# Patient Record
Sex: Female | Born: 1995 | Race: Black or African American | Hispanic: No | Marital: Single | Smoking: Never smoker
Health system: Southern US, Community
[De-identification: ages and names within clinical notes are randomized; demographics above are authoritative.]

## PROBLEM LIST (undated history)

## (undated) ENCOUNTER — Inpatient Hospital Stay (HOSPITAL_COMMUNITY): Payer: Self-pay

## (undated) DIAGNOSIS — R51 Headache: Secondary | ICD-10-CM

## (undated) DIAGNOSIS — R519 Headache, unspecified: Secondary | ICD-10-CM

---

## 2016-06-24 ENCOUNTER — Encounter (HOSPITAL_COMMUNITY): Payer: Self-pay

## 2016-06-24 ENCOUNTER — Emergency Department (HOSPITAL_COMMUNITY)
Admission: EM | Admit: 2016-06-24 | Discharge: 2016-06-24 | Disposition: A | Discharge status: Home / Self Care | Payer: Medicaid Other | Attending: Emergency Medicine | Admitting: Emergency Medicine

## 2016-06-24 DIAGNOSIS — Z3A00 Weeks of gestation of pregnancy not specified: Secondary | ICD-10-CM | POA: Diagnosis not present

## 2016-06-24 DIAGNOSIS — Z3490 Encounter for supervision of normal pregnancy, unspecified, unspecified trimester: Secondary | ICD-10-CM

## 2016-06-24 DIAGNOSIS — O219 Vomiting of pregnancy, unspecified: Secondary | ICD-10-CM | POA: Diagnosis not present

## 2016-06-24 HISTORY — DX: Headache: R51

## 2016-06-24 HISTORY — DX: Headache, unspecified: R51.9

## 2016-06-24 LAB — CBC
HCT: 37.1 % (ref 36.0–46.0)
HEMOGLOBIN: 12.2 g/dL (ref 12.0–15.0)
MCH: 27.7 pg (ref 26.0–34.0)
MCHC: 32.9 g/dL (ref 30.0–36.0)
MCV: 84.3 fL (ref 78.0–100.0)
Platelets: 229 10*3/uL (ref 150–400)
RBC: 4.4 MIL/uL (ref 3.87–5.11)
RDW: 13.3 % (ref 11.5–15.5)
WBC: 6.3 10*3/uL (ref 4.0–10.5)

## 2016-06-24 LAB — COMPREHENSIVE METABOLIC PANEL
ALK PHOS: 27 U/L — AB (ref 38–126)
ALT: 9 U/L — AB (ref 14–54)
AST: 22 U/L (ref 15–41)
Albumin: 4 g/dL (ref 3.5–5.0)
Anion gap: 6 (ref 5–15)
BUN: 10 mg/dL (ref 6–20)
CALCIUM: 9.4 mg/dL (ref 8.9–10.3)
CO2: 22 mmol/L (ref 22–32)
CREATININE: 0.6 mg/dL (ref 0.44–1.00)
Chloride: 107 mmol/L (ref 101–111)
GFR calc Af Amer: >60 mL/min (ref >60)
Glucose, Bld: 77 mg/dL (ref 65–99)
Potassium: 4 mmol/L (ref 3.5–5.1)
Sodium: 135 mmol/L (ref 135–145)
Total Bilirubin: 1 mg/dL (ref 0.3–1.2)
Total Protein: 7.2 g/dL (ref 6.5–8.1)

## 2016-06-24 LAB — POC URINE PREG, ED: Preg Test, Ur: POSITIVE — AB

## 2016-06-24 NOTE — ED Notes (Signed)
Declined W/C at D/C and was escorted to lobby by RN. 

## 2016-06-24 NOTE — ED Triage Notes (Addendum)
Patient complains of generalized headache with nausea and photophobia x 1 week. States taking ibuprofen with minimal relief, denies trauma. alert and oriented, NAD.no cold symptoms, denies congestion

## 2016-06-24 NOTE — ED Provider Notes (Signed)
[facility]-EMERGENCY DEPT Provider Note   CSN: [id number] Arrival date & time: 06/24/16  1427     History   Chief Complaint Chief Complaint  Patient presents with  . Headache    HPI Aimee Wong is a 21 y.o. female.  She presents today for multiple weeks of worsening nausea, vomiting, low energy and headache. She reports that her symptoms are intermittent but that over the past week her nausea/vomiting, and headaches are getting more frequent. She reports that she has been able to eat and stay adequately hydrated. She reports that she has tried ibuprofen for her headaches however reports that it has not helped.  She also endorses mild photophobia, and reports that she prefers having the light off but describes no difficulty using her cell phone.  She denies any recent sick contacts.  She denies any fevers or chills at home, no hot flashes.  No neck pain/stiffness.    She is unable to recall the date of her last menstrual period, reports that she is sexually active with her boyfriend.  She has previously been pregnant and has a 10-year-old daughter.  She denies any abnormal vaginal bleeding, discharge, or pain.        Past Medical History:  Diagnosis Date  . Headache     There are no active problems to display for this patient.   History reviewed. No pertinent surgical history.  OB History    No data available       Home Medications    Prior to Admission medications   Not on File    Family History No family history on file.  Social History Social History  Substance Use Topics  . Smoking status: Never Smoker  . Smokeless tobacco: Never Used  . Alcohol use No     Allergies   Patient has no known allergies.   Review of Systems Review of Systems  Constitutional: Positive for fatigue. Negative for chills, fever and unexpected weight change.  HENT: Negative for ear pain and sore throat.   Eyes: Negative for pain and visual disturbance.  Respiratory: Positive for  shortness of breath (Occasionally, when walking, not new). Negative for cough and chest tightness.   Cardiovascular: Negative for chest pain, palpitations and leg swelling.  Gastrointestinal: Positive for nausea and vomiting. Negative for abdominal pain, constipation and diarrhea.  Genitourinary: Negative for dysuria, flank pain, frequency, hematuria, menstrual problem, vaginal bleeding, vaginal discharge and vaginal pain.  Musculoskeletal: Negative for arthralgias and back pain.  Skin: Negative for color change and rash.  Neurological: Positive for light-headedness and headaches. Negative for seizures and syncope.  All other systems reviewed and are negative.    Physical Exam Updated Vital Signs BP 104/66 (BP Location: Right Arm)   Pulse 75   Temp 98.3 F (36.8 C) (Oral)   Resp 14   Ht  (1.575 m)   Wt 53.1 kg   SpO2 100%   BMI 21.40 kg/m   Physical Exam  Constitutional: She is oriented to person, place, and time. Vital signs are normal. She appears well-developed and well-nourished.  Non-toxic appearance. She does not have a sickly appearance. She does not appear ill. No distress.  HENT:  Head: Normocephalic and atraumatic. Head is without contusion.  Right Ear: External ear normal.  Left Ear: External ear normal.  Nose: Nose normal.  Mouth/Throat: Uvula is midline and oropharynx is clear and moist.  Eyes: Conjunctivae are normal. Pupils are equal, round, and reactive to light. No scleral icterus.  Neck:  Normal range of motion. Neck supple. No tracheal deviation present.  Cardiovascular: Normal rate, regular rhythm, S1 normal, S2 normal, normal heart sounds and intact distal pulses.  Exam reveals no gallop and no friction rub.   No murmur heard. Pulmonary/Chest: Effort normal and breath sounds normal. No accessory muscle usage. No respiratory distress. She exhibits no retraction.  Abdominal: Soft. Bowel sounds are normal. There is no hepatosplenomegaly. There is no  tenderness.  Her uterus was not palpable.    Musculoskeletal: She exhibits no edema.  Neurological: She is alert and oriented to person, place, and time. She has normal strength. No cranial nerve deficit or sensory deficit. She exhibits normal muscle tone. Gait normal. GCS eye subscore is 4. GCS verbal subscore is 5. GCS motor subscore is 6.  Skin: Skin is warm and dry. She is not diaphoretic.  Psychiatric: She has a normal mood and affect. Her behavior is normal.  Nursing note and vitals reviewed.    ED Treatments / Results  Labs (all labs ordered are listed, but only abnormal results are displayed) Labs Reviewed  COMPREHENSIVE METABOLIC PANEL - Abnormal; Notable for the following:       Result Value   ALT 9 (*)    Alkaline Phosphatase 27 (*)    All other components within normal limits  POC URINE PREG, ED - Abnormal; Notable for the following:    Preg Test, Ur POSITIVE (*)    All other components within normal limits  CBC    EKG  EKG Interpretation None       Radiology No results found.  Procedures Procedures (including critical care time)  Medications Ordered in ED Medications - No data to display   Initial Impression / Assessment and Plan / ED Course  I have reviewed the triage vital signs and the nursing notes.  Pertinent labs & imaging results that were available during my care of the patient were reviewed by me and considered in my medical decision making (see chart for details).    Aimee Wong presents today for evaluation of gradually worsening nausea/vomiting, weakness, and headache.  Her urine pregnancy test came back positive.  She is afebrile, normotensive with a normal heart rate.  She has been pregnant before and reports that "preggo pops" helped with her nausea and stated she would try them again.  She was advised not to drink alcohol, or consume drugs while pregnant. She was advised to start taking a prenatal vitamin.  Was given the contact information  for the woman's clinic and instructed to follow up with them.  At this point as she is not having pelvic pain, abnormal vaginal bleeding, and is normotensive I do not have cause to suspect a ruptured ectopic pregnancy.  He was advised to eat multiple small meals throughout the day, and to refrain from taking any over-the-counter medications including ibuprofen, unless told otherwise by the woman's clinic.  At discharge she was given a handout containing additional pregnancy related guidelines.  Additionally she was given return precautions including but not limited to signs and symptoms of ectopic pregnancy , dehydration, fever, chills.    Her lab work was reviewed she does not appear to be anemic, or have any significant electrolyte imbalances at this time. At this time there does not appear to be any evidence of an acute emergency medical condition and the patient appears stable for discharge with appropriate outpatient follow up.Diagnosis was discussed with patient who verbalizes understanding and is agreeable to discharge. Pt case discussed  with Dr. Patria Mane who agrees with my plan.   Final Clinical Impressions(s) / ED Diagnoses   Final diagnoses:  Pregnancy, unspecified gestational age  Nausea and vomiting during pregnancy    New Prescriptions There are no discharge medications for this patient.    Cristina Gong, PA-C 06/24/16 1901    Azalia Bilis, MD 06/26/16 [phone]

## 2016-07-05 ENCOUNTER — Encounter: Payer: Self-pay | Admitting: Advanced Practice Midwife

## 2016-07-05 ENCOUNTER — Inpatient Hospital Stay (HOSPITAL_COMMUNITY): Payer: Medicaid Other

## 2016-07-05 ENCOUNTER — Inpatient Hospital Stay (HOSPITAL_COMMUNITY)
Admission: AD | Admit: 2016-07-05 | Discharge: 2016-07-05 | Disposition: A | Discharge status: Home / Self Care | Payer: Medicaid Other | Source: Ambulatory Visit | Attending: Obstetrics & Gynecology | Admitting: Obstetrics & Gynecology

## 2016-07-05 DIAGNOSIS — Z3A08 8 weeks gestation of pregnancy: Secondary | ICD-10-CM | POA: Diagnosis not present

## 2016-07-05 DIAGNOSIS — R1030 Lower abdominal pain, unspecified: Secondary | ICD-10-CM | POA: Diagnosis present

## 2016-07-05 DIAGNOSIS — O26891 Other specified pregnancy related conditions, first trimester: Secondary | ICD-10-CM | POA: Insufficient documentation

## 2016-07-05 DIAGNOSIS — Z3491 Encounter for supervision of normal pregnancy, unspecified, first trimester: Secondary | ICD-10-CM

## 2016-07-05 DIAGNOSIS — O208 Other hemorrhage in early pregnancy: Secondary | ICD-10-CM | POA: Diagnosis not present

## 2016-07-05 DIAGNOSIS — R109 Unspecified abdominal pain: Secondary | ICD-10-CM

## 2016-07-05 DIAGNOSIS — O2341 Unspecified infection of urinary tract in pregnancy, first trimester: Secondary | ICD-10-CM | POA: Diagnosis not present

## 2016-07-05 LAB — URINALYSIS, ROUTINE W REFLEX MICROSCOPIC
BILIRUBIN URINE: NEGATIVE
Glucose, UA: NEGATIVE mg/dL
HGB URINE DIPSTICK: NEGATIVE
Ketones, ur: 80 mg/dL — AB
NITRITE: POSITIVE — AB
Protein, ur: 30 mg/dL — AB
SPECIFIC GRAVITY, URINE: 1.029 (ref 1.005–1.030)
pH: 6 (ref 5.0–8.0)

## 2016-07-05 LAB — CBC
HCT: 37.8 % (ref 36.0–46.0)
Hemoglobin: 12.7 g/dL (ref 12.0–15.0)
MCH: 28.2 pg (ref 26.0–34.0)
MCHC: 33.6 g/dL (ref 30.0–36.0)
MCV: 83.8 fL (ref 78.0–100.0)
PLATELETS: 272 10*3/uL (ref 150–400)
RBC: 4.51 MIL/uL (ref 3.87–5.11)
RDW: 13 % (ref 11.5–15.5)
WBC: 6.6 10*3/uL (ref 4.0–10.5)

## 2016-07-05 LAB — WET PREP, GENITAL
Sperm: NONE SEEN
Trich, Wet Prep: NONE SEEN

## 2016-07-05 LAB — HCG, QUANTITATIVE, PREGNANCY: hCG, Beta Chain, Quant, S: 165443 m[IU]/mL — ABNORMAL HIGH (ref <5)

## 2016-07-05 MED ORDER — CONCEPT OB 130-92.4-1 MG PO CAPS: 1.0000 | ORAL_CAPSULE | Freq: Every day | ORAL | 12 refills | Status: AC

## 2016-07-05 MED ORDER — NITROFURANTOIN MONOHYD MACRO 100 MG PO CAPS: 100.0000 mg | ORAL_CAPSULE | Freq: Two times a day (BID) | ORAL | 0 refills | Status: AC

## 2016-07-05 NOTE — MAU Note (Signed)
Pt states she had a +upt at [facility] several weeks ago. Started having lower abdominal cramping that started several days ago. Pt denies vaginal bleeding or vaginal discharge. Pt denies pain at this time. Has not taken anything for pain.

## 2016-07-05 NOTE — Discharge Instructions (Signed)
[facility] [employer] for [employer] at [facility]       Phone: [phone]  Center for [employer] at [employer] Phone: [phone]  Center for [employer] at [place]  Phone: [phone]  Center for [employer] at [employer]  Phone: [phone]  Center for [facility] [place]  Phone: [phone]  [facility]       Phone: [phone]  [facility]    Phone: [phone]   [facility] [place])    Phone: [phone]  [facility]    Phone: [phone]  [facility]    Phone: [phone]  [facility]    Phone: [phone]  [facility]       Phone: [phone]   [facility]  Phone: [phone]  [facility]    Phone: [phone]  [facility]   Phone: [phone]  [facility]      Phone: [phone]  [facility]    Phone: [phone]    Abdominal Pain During Pregnancy Abdominal pain is common in pregnancy. Most of the time, it does not cause harm. There are many causes of abdominal pain. Some causes are more serious than others and sometimes the cause is not known. Abdominal pain can be a sign that something is very wrong with the pregnancy or the pain may have nothing to do with the pregnancy. Always tell your health care provider if you have any abdominal pain. Follow these instructions at home:  Do not have sex or put anything in your vagina until your symptoms go away completely.  Watch your abdominal pain for any changes.  Get plenty of rest until your pain improves.  Drink enough fluid to keep your urine clear or pale yellow.  Take over-the-counter or prescription medicines only as told by your health care provider.  Keep  all follow-up visits as told by your health care provider. This is important. Contact a health care provider if:  You have a fever.  Your pain gets worse or you have cramping.  Your pain continues after resting. Get help right away if:  You are bleeding, leaking fluid, or passing tissue from the vagina.  You have vomiting or diarrhea that does not go away.  You have painful or bloody urination.  You notice a decrease in your baby's movements.  You feel very weak or faint.  You have shortness of breath.  You develop a severe headache with abdominal pain.  You have abnormal vaginal discharge with abdominal pain. This information is not intended to replace advice given to you by your health care provider. Make sure you discuss any questions you have with your health care provider. Document Released: 03/13/2005 Document Revised: 12/23/2015 Document Reviewed: 10/10/2012 Elsevier Interactive Patient Education  2017 [employer].   First Trimester of Pregnancy The first trimester of pregnancy is from week 1 until the end of week 13 (months 1 through 3). A week after a sperm fertilizes an egg, the egg will implant on the wall of the uterus. This embryo will begin to develop into a baby. Genes from you and your partner will form the baby. The female genes will determine whether the baby will be a boy or a girl. At 6-8 weeks, the eyes and face will be formed, and the heartbeat can be seen on ultrasound. At the end of 12 weeks, all  the baby's organs will be formed. Now that you are pregnant, you will want to do everything you can to have a healthy baby. Two of the most important things are to get good prenatal care and to follow your health care provider's instructions. Prenatal care is all the medical care you receive before the baby's birth. This care will help prevent, find, and treat any problems during the pregnancy and childbirth. Body changes during your first trimester Your body goes  through many changes during pregnancy. The changes vary from woman to woman.  You may gain or lose a couple of pounds at first.  You may feel sick to your stomach (nauseous) and you may throw up (vomit). If the vomiting is uncontrollable, call your health care provider.  You may tire easily.  You may develop headaches that can be relieved by medicines. All medicines should be approved by your health care provider.  You may urinate more often. Painful urination may mean you have a bladder infection.  You may develop heartburn as a result of your pregnancy.  You may develop constipation because certain hormones are causing the muscles that push stool through your intestines to slow down.  You may develop hemorrhoids or swollen veins (varicose veins).  Your breasts may begin to grow larger and become tender. Your nipples may stick out more, and the tissue that surrounds them (areola) may become darker.  Your gums may bleed and may be sensitive to brushing and flossing.  Dark spots or blotches (chloasma, mask of pregnancy) may develop on your face. This will likely fade after the baby is born.  Your menstrual periods will stop.  You may have a loss of appetite.  You may develop cravings for certain kinds of food.  You may have changes in your emotions from day to day, such as being excited to be pregnant or being concerned that something may go wrong with the pregnancy and baby.  You may have more vivid and strange dreams.  You may have changes in your hair. These can include thickening of your hair, rapid growth, and changes in texture. Some women also have hair loss during or after pregnancy, or hair that feels dry or thin. Your hair will most likely return to normal after your baby is born. What to expect at prenatal visits During a routine prenatal visit:  You will be weighed to make sure you and the baby are growing normally.  Your blood pressure will be taken.  Your  abdomen will be measured to track your baby's growth.  The fetal heartbeat will be listened to between weeks 10 and 14 of your pregnancy.  Test results from any previous visits will be discussed. Your health care provider may ask you:  How you are feeling.  If you are feeling the baby move.  If you have had any abnormal symptoms, such as leaking fluid, bleeding, severe headaches, or abdominal cramping.  If you are using any tobacco products, including cigarettes, chewing tobacco, and electronic cigarettes.  If you have any questions. Other tests that may be performed during your first trimester include:  Blood tests to find your blood type and to check for the presence of any previous infections. The tests will also be used to check for low iron levels (anemia) and protein on red blood cells (Rh antibodies). Depending on your risk factors, or if you previously had diabetes during pregnancy, you may have tests to check for high blood sugar that affects pregnant women (  gestational diabetes).  Urine tests to check for infections, diabetes, or protein in the urine.  An ultrasound to confirm the proper growth and development of the baby.  Fetal screens for spinal cord problems (spina bifida) and Down syndrome.  HIV (human immunodeficiency virus) testing. Routine prenatal testing includes screening for HIV, unless you choose not to have this test.  You may need other tests to make sure you and the baby are doing well. Follow these instructions at home: Medicines   Follow your health care provider's instructions regarding medicine use. Specific medicines may be either safe or unsafe to take during pregnancy.  Take a prenatal vitamin that contains at least 600 micrograms (mcg) of folic acid.  If you develop constipation, try taking a stool softener if your health care provider approves. Eating and drinking   Eat a balanced diet that includes fresh fruits and vegetables, whole grains,  good sources of protein such as meat, eggs, or tofu, and low-fat dairy. Your health care provider will help you determine the amount of weight gain that is right for you.  Avoid raw meat and uncooked cheese. These carry germs that can cause birth defects in the baby.  Eating four or five small meals rather than three large meals a day may help relieve nausea and vomiting. If you start to feel nauseous, eating a few soda crackers can be helpful. Drinking liquids between meals, instead of during meals, also seems to help ease nausea and vomiting.  Limit foods that are high in fat and processed sugars, such as fried and sweet foods.  To prevent constipation:  Eat foods that are high in fiber, such as fresh fruits and vegetables, whole grains, and beans.  Drink enough fluid to keep your urine clear or pale yellow. Activity   Exercise only as directed by your health care provider. Most women can continue their usual exercise routine during pregnancy. Try to exercise for 30 minutes at least 5 days a week. Exercising will help you:  Control your weight.  Stay in shape.  Be prepared for labor and delivery.  Experiencing pain or cramping in the lower abdomen or lower back is a good sign that you should stop exercising. Check with your health care provider before continuing with normal exercises.  Try to avoid standing for long periods of time. Move your legs often if you must stand in one place for a long time.  Avoid heavy lifting.  Wear low-heeled shoes and practice good posture.  You may continue to have sex unless your health care provider tells you not to. Relieving pain and discomfort   Wear a good support bra to relieve breast tenderness.  Take warm sitz baths to soothe any pain or discomfort caused by hemorrhoids. Use hemorrhoid cream if your health care provider approves.  Rest with your legs elevated if you have leg cramps or low back pain.  If you develop varicose veins in  your legs, wear support hose. Elevate your feet for 15 minutes, 3-4 times a day. Limit salt in your diet. Prenatal care   Schedule your prenatal visits by the twelfth week of pregnancy. They are usually scheduled monthly at first, then more often in the last 2 months before delivery.  Write down your questions. Take them to your prenatal visits.  Keep all your prenatal visits as told by your health care provider. This is important. Safety   Wear your seat belt at all times when driving.  Make a list of emergency  phone numbers, including numbers for family, friends, the hospital, and police and fire departments. General instructions   Ask your health care provider for a referral to a local prenatal education class. Begin classes no later than the beginning of month 6 of your pregnancy.  Ask for help if you have counseling or nutritional needs during pregnancy. Your health care provider can offer advice or refer you to specialists for help with various needs.  Do not use hot tubs, steam rooms, or saunas.  Do not douche or use tampons or scented sanitary pads.  Do not cross your legs for long periods of time.  Avoid cat litter boxes and soil used by cats. These carry germs that can cause birth defects in the baby and possibly loss of the fetus by miscarriage or stillbirth.  Avoid all smoking, herbs, alcohol, and medicines not prescribed by your health care provider. Chemicals in these products affect the formation and growth of the baby.  Do not use any products that contain nicotine or tobacco, such as cigarettes and e-cigarettes. If you need help quitting, ask your health care provider. You may receive counseling support and other resources to help you quit.  Schedule a dentist appointment. At home, brush your teeth with a soft toothbrush and be gentle when you floss. Contact a health care provider if:  You have dizziness.  You have mild pelvic cramps, pelvic pressure, or nagging  pain in the abdominal area.  You have persistent nausea, vomiting, or diarrhea.  You have a bad smelling vaginal discharge.  You have pain when you urinate.  You notice increased swelling in your face, hands, legs, or ankles.  You are exposed to fifth disease or chickenpox.  You are exposed to [place] measles (rubella) and have never had it. Get help right away if:  You have a fever.  You are leaking fluid from your vagina.  You have spotting or bleeding from your vagina.  You have severe abdominal cramping or pain.  You have rapid weight gain or loss.  You vomit blood or material that looks like coffee grounds.  You develop a severe headache.  You have shortness of breath.  You have any kind of trauma, such as from a fall or a car accident. Summary  The first trimester of pregnancy is from week 1 until the end of week 13 (months 1 through 3).  Your body goes through many changes during pregnancy. The changes vary from woman to woman.  You will have routine prenatal visits. During those visits, your health care provider will examine you, discuss any test results you may have, and talk with you about how you are feeling. This information is not intended to replace advice given to you by your health care provider. Make sure you discuss any questions you have with your health care provider. Document Released: 03/07/2001 Document Revised: 02/23/2016 Document Reviewed: 02/23/2016 Elsevier Interactive Patient Education  2017 [employer].

## 2016-07-05 NOTE — MAU Provider Note (Signed)
Chief Complaint: Abdominal Pain   First Provider Initiated Contact with Patient 07/05/16 2249     SUBJECTIVE HPI: Aimee Wong is a 21 y.o. G1P0 at [redacted]w[redacted]d who presents to Maternity Admissions reporting: low abd pain x a several days.   Vaginal Bleeding: None Passage of tissue or clots: None Dizziness: None  O POS  Pain Location: Low abd Quality: cramping Severity: Moderate Duration: several days Course: waxing and waning Context: Early pregnancy Timing: intermittent Modifying factors: None. Hasn't tried anything for the pain.  Associated signs and symptoms: Pos for urinary frequency. Neg or fever, chills, dysuria, hematuria, GI complaints, VB, vaginal discharge.   Past Medical History:  Diagnosis Date  . Headache    OB History  Gravida Para Term Preterm AB Living  1            SAB TAB Ectopic Multiple Live Births               # Outcome Date GA Lbr Len/2nd Weight Sex Delivery Anes PTL Lv  1 Current              No past surgical history on file. Social History   Social History  . Marital status: Single    Spouse name: N/A  . Number of children: N/A  . Years of education: N/A   Occupational History  . Not on file.   Social History Main Topics  . Smoking status: Never Smoker  . Smokeless tobacco: Never Used  . Alcohol use No  . Drug use: Unknown  . Sexual activity: Not on file   Other Topics Concern  . Not on file   Social History Narrative  . No narrative on file   No current facility-administered medications on file prior to encounter.    No current outpatient prescriptions on file prior to encounter.   No Known Allergies  I have reviewed the past Medical Hx, Surgical Hx, Social Hx, Allergies and Medications.   Review of Systems  Constitutional: Negative for appetite change, chills and fever.  Gastrointestinal: Positive for abdominal pain. Negative for abdominal distention, constipation, diarrhea, nausea and vomiting.  Genitourinary: Positive for  frequency, pelvic pain and urgency. Negative for dysuria, flank pain, hematuria, vaginal bleeding and vaginal discharge.  Musculoskeletal: Negative for back pain.    OBJECTIVE Patient Vitals for the past 24 hrs:  BP Temp Temp src Pulse Resp SpO2 Height Weight  07/05/16 2028 109/67 98.3 F (36.8 C) Oral 72 16 100 % 5' 3.5" (1.613 m) 115 lb (52.2 kg)   Constitutional: Well-developed, well-nourished female in no acute distress.  Cardiovascular: normal rate Respiratory: normal rate and effort.  GI: Abd soft, non-tender. Pos BS x 4 MS: Extremities nontender, no edema, normal ROM Neurologic: Alert and oriented x 4.  GU: Neg CVAT.  SPECULUM EXAM: NEFG, moderate amount of creamy, white, malodorous discharge, no blood noted, cervix clean  BIMANUAL: cervix long and closed; uterus 8-9 week size, no adnexal tenderness or masses. No CMT.  LAB RESULTS Results for orders placed or performed during the hospital encounter of 07/05/16 (from the past 24 hour(s))  Urinalysis, Routine w reflex microscopic     Status: Abnormal   Collection Time: 07/05/16  8:31 PM  Result Value Ref Range   Color, Urine YELLOW YELLOW   APPearance HAZY (A) CLEAR   Specific Gravity, Urine 1.029 1.005 - 1.030   pH 6.0 5.0 - 8.0   Glucose, UA NEGATIVE NEGATIVE mg/dL   Hgb urine dipstick NEGATIVE NEGATIVE  Bilirubin Urine NEGATIVE NEGATIVE   Ketones, ur 80 (A) NEGATIVE mg/dL   Protein, ur 30 (A) NEGATIVE mg/dL   Nitrite POSITIVE (A) NEGATIVE   Leukocytes, UA MODERATE (A) NEGATIVE   RBC / HPF 0-5 0 - 5 RBC/hpf   WBC, UA 6-30 0 - 5 WBC/hpf   Bacteria, UA MANY (A) NONE SEEN   Squamous Epithelial / LPF 0-5 (A) NONE SEEN   Mucous PRESENT   CBC     Status: None   Collection Time: 07/05/16  8:35 PM  Result Value Ref Range   WBC 6.6 4.0 - 10.5 K/uL   RBC 4.51 3.87 - 5.11 MIL/uL   Hemoglobin 12.7 12.0 - 15.0 g/dL   HCT 16.1 09.6 - 04.5 %   MCV 83.8 78.0 - 100.0 fL   MCH 28.2 26.0 - 34.0 pg   MCHC 33.6 30.0 - 36.0 g/dL    RDW 40.9 81.1 - 91.4 %   Platelets 272 150 - 400 K/uL  hCG, quantitative, pregnancy     Status: Abnormal   Collection Time: 07/05/16  8:35 PM  Result Value Ref Range   hCG, Beta Chain, Quant, S 165,443 (H) <5 mIU/mL  ABO/Rh     Status: None (Preliminary result)   Collection Time: 07/05/16  8:35 PM  Result Value Ref Range   ABO/RH(D) O POS     IMAGING US Ob Comp Less 14 Wks  Result Date: 07/05/2016 CLINICAL DATA:  21 y/o  F; pregnant patient with pain. EXAM: OBSTETRIC <14 WK [place] AND TRANSVAGINAL OB US TECHNIQUE: Both transabdominal and transvaginal ultrasound examinations were performed for complete evaluation of the gestation as well as the maternal uterus, adnexal regions, and pelvic cul-de-sac. Transvaginal technique was performed to assess early pregnancy. COMPARISON:  None. FINDINGS: Intrauterine gestational sac: Single Yolk sac:  Visualized. Embryo:  Visualized. Cardiac Activity: Visualized. Heart Rate: 174  bpm CRL:  23  mm   8 w   6 d                  [place] EDC: 02/08/2017 Subchorionic hemorrhage:  Small volume. Maternal uterus/adnexae: Normal.  Left-sided corpus luteum. IMPRESSION: Single live intrauterine pregnancy with estimated gestational age of [redacted] weeks and 6 days. Small subchorionic hemorrhage. Electronically Signed   By: Mitzi Hansen M.D.   On: 07/05/2016 21:46   US Ob Transvaginal  Result Date: 07/05/2016 CLINICAL DATA:  21 y/o  F; pregnant patient with pain. EXAM: OBSTETRIC <14 WK [place] AND TRANSVAGINAL OB US TECHNIQUE: Both transabdominal and transvaginal ultrasound examinations were performed for complete evaluation of the gestation as well as the maternal uterus, adnexal regions, and pelvic cul-de-sac. Transvaginal technique was performed to assess early pregnancy. COMPARISON:  None. FINDINGS: Intrauterine gestational sac: Single Yolk sac:  Visualized. Embryo:  Visualized. Cardiac Activity: Visualized. Heart Rate: 174  bpm CRL:  23  mm   8 w   6 d                  [place]  EDC: 02/08/2017 Subchorionic hemorrhage:  Small volume. Maternal uterus/adnexae: Normal.  Left-sided corpus luteum. IMPRESSION: Single live intrauterine pregnancy with estimated gestational age of [redacted] weeks and 6 days. Small subchorionic hemorrhage. Electronically Signed   By: Mitzi Hansen M.D.   On: 07/05/2016 21:46    MAU COURSE CBC, Quant, ABO/Rh, ultrasound, wet prep and GC/chlamydia culture, UA  MDM Pain in early pregnancy with normal intrauterine pregnancy and hemodynamically stable. Pain likely 2/2 UTI.   ASSESSMENT 1.  UTI (urinary tract infection) during pregnancy, first trimester   2. Abdominal pain during pregnancy, first trimester   3. Normal IUP (intrauterine pregnancy) on prenatal ultrasound, first trimester     PLAN Discharge home in stable condition. First precautions Wet prep, GC/Chlamydia cultures pending.  Follow-up Information    Obstetrician of your choice Follow up.   Why:  Start prenatal care       [facility] [place] [facility]ERN[facility] ADMISSIONS Follow up.   Why:  in emergencies Contact information: [address] [id number] [place] [address] [phone]         Allergies as of 07/05/2016   No Known Allergies     Medication List    TAKE these medications   CONCEPT OB 130-92.4-1 MG Caps Take 1 tablet by mouth daily.   nitrofurantoin (macrocrystal-monohydrate) 100 MG capsule Commonly known as:  MACROBID Take 1 capsule (100 mg total) by mouth 2 (two) times daily.        [place], CNM 07/05/2016  11:00 PM  4

## 2016-07-06 ENCOUNTER — Other Ambulatory Visit: Payer: Self-pay | Admitting: Advanced Practice Midwife

## 2016-07-06 DIAGNOSIS — B3731 Acute candidiasis of vulva and vagina: Secondary | ICD-10-CM

## 2016-07-06 DIAGNOSIS — N760 Acute vaginitis: Principal | ICD-10-CM

## 2016-07-06 DIAGNOSIS — B9689 Other specified bacterial agents as the cause of diseases classified elsewhere: Secondary | ICD-10-CM

## 2016-07-06 DIAGNOSIS — B373 Candidiasis of vulva and vagina: Secondary | ICD-10-CM

## 2016-07-06 LAB — HIV ANTIBODY (ROUTINE TESTING W REFLEX): HIV Screen 4th Generation wRfx: NONREACTIVE

## 2016-07-06 LAB — GC/CHLAMYDIA PROBE AMP (~~LOC~~) NOT AT ARMC
CHLAMYDIA, DNA PROBE: NEGATIVE
NEISSERIA GONORRHEA: NEGATIVE

## 2016-07-06 LAB — ABO/RH: ABO/RH(D): O POS

## 2016-07-06 MED ORDER — METRONIDAZOLE 500 MG PO TABS: 500.0000 mg | ORAL_TABLET | Freq: Two times a day (BID) | ORAL | 0 refills | Status: AC

## 2016-07-06 MED ORDER — TERCONAZOLE 0.4 % VA CREA: 1.0000 | TOPICAL_CREAM | Freq: Every day | VAGINAL | 0 refills | Status: AC

## 2016-07-06 NOTE — Progress Notes (Signed)
Dx BV, yeast infection.

## 2016-07-12 NOTE — Progress Notes (Signed)
I called [place] and left a message we are calling with some information- please call back during office hours.

## 2016-07-13 ENCOUNTER — Telehealth: Payer: Self-pay | Admitting: *Deleted

## 2016-07-13 NOTE — Telephone Encounter (Signed)
Per [place] CNM, pt has BV and yeast. Meds have been sent in to pharmacy. Attempted to call pt, no answer. VM left stating I am calling regarding test results. Please return my call at the clinic.

## 2016-07-20 NOTE — Telephone Encounter (Signed)
Attempted to call patient. A woman answered and stated we have a wrong number. Called patient's mother and patient happened to be there. She confirmed the wrong number. Will update number in system.  Relayed test results to patient and told her about the prescriptions waiting at her pharmacy. Understanding voiced.

## 2016-07-25 ENCOUNTER — Telehealth: Payer: Self-pay | Admitting: *Deleted

## 2016-07-25 NOTE — Telephone Encounter (Signed)
Pt left message requesting that her prescription be sent to [facility] @ [address]   I called and spoke with pt.  She will call her pharmacy and have Rx transferred. Preferred pharmacy information updated in her EMR.

## 2016-10-23 ENCOUNTER — Encounter (HOSPITAL_COMMUNITY): Payer: Self-pay

## 2016-10-23 ENCOUNTER — Emergency Department (HOSPITAL_COMMUNITY)
Admission: EM | Admit: 2016-10-23 | Discharge: 2016-10-23 | Disposition: A | Discharge status: Home / Self Care | Payer: Medicaid Other | Attending: Emergency Medicine | Admitting: Emergency Medicine

## 2016-10-23 ENCOUNTER — Emergency Department (HOSPITAL_COMMUNITY): Payer: Medicaid Other

## 2016-10-23 DIAGNOSIS — Y9389 Activity, other specified: Secondary | ICD-10-CM | POA: Insufficient documentation

## 2016-10-23 DIAGNOSIS — Z79899 Other long term (current) drug therapy: Secondary | ICD-10-CM | POA: Insufficient documentation

## 2016-10-23 DIAGNOSIS — N939 Abnormal uterine and vaginal bleeding, unspecified: Secondary | ICD-10-CM | POA: Insufficient documentation

## 2016-10-23 DIAGNOSIS — M25531 Pain in right wrist: Secondary | ICD-10-CM | POA: Diagnosis present

## 2016-10-23 DIAGNOSIS — Y999 Unspecified external cause status: Secondary | ICD-10-CM | POA: Diagnosis not present

## 2016-10-23 DIAGNOSIS — Z3202 Encounter for pregnancy test, result negative: Secondary | ICD-10-CM | POA: Diagnosis not present

## 2016-10-23 DIAGNOSIS — Y929 Unspecified place or not applicable: Secondary | ICD-10-CM | POA: Insufficient documentation

## 2016-10-23 DIAGNOSIS — X509XXA Other and unspecified overexertion or strenuous movements or postures, initial encounter: Secondary | ICD-10-CM | POA: Diagnosis not present

## 2016-10-23 LAB — HCG, QUANTITATIVE, PREGNANCY: hCG, Beta Chain, Quant, S: 1 m[IU]/mL (ref <5)

## 2016-10-23 NOTE — Discharge Instructions (Signed)
Take 4 over the counter ibuprofen tablets 3 times a day or 2 over-the-counter naproxen tablets twice a day for pain. Also take tylenol 1000mg(2 extra strength) four times a day.    
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
## 2016-11-14 ENCOUNTER — Encounter (HOSPITAL_COMMUNITY): Payer: Self-pay

## 2016-11-14 DIAGNOSIS — Z79899 Other long term (current) drug therapy: Secondary | ICD-10-CM | POA: Insufficient documentation

## 2016-11-14 DIAGNOSIS — Z3202 Encounter for pregnancy test, result negative: Secondary | ICD-10-CM | POA: Diagnosis not present

## 2016-11-14 DIAGNOSIS — L02214 Cutaneous abscess of groin: Secondary | ICD-10-CM | POA: Diagnosis not present

## 2016-11-14 DIAGNOSIS — R1032 Left lower quadrant pain: Secondary | ICD-10-CM | POA: Diagnosis present

## 2016-11-14 LAB — POC URINE PREG, ED: Preg Test, Ur: NEGATIVE

## 2016-11-14 NOTE — ED Triage Notes (Signed)
Onset yesterday lump to left groin, pain when walking.  Pt took pregnancy test several days ago and was positive, wants confirmed.

## 2016-11-15 ENCOUNTER — Emergency Department (HOSPITAL_COMMUNITY)
Admission: EM | Admit: 2016-11-15 | Discharge: 2016-11-15 | Disposition: A | Discharge status: Home / Self Care | Payer: Medicaid Other | Source: Home / Self Care | Attending: Emergency Medicine | Admitting: Emergency Medicine

## 2016-11-15 ENCOUNTER — Emergency Department (HOSPITAL_COMMUNITY)
Admission: EM | Admit: 2016-11-15 | Discharge: 2016-11-15 | Disposition: A | Discharge status: Home / Self Care | Payer: Medicaid Other | Attending: Emergency Medicine | Admitting: Emergency Medicine

## 2016-11-15 DIAGNOSIS — L0291 Cutaneous abscess, unspecified: Secondary | ICD-10-CM

## 2016-11-15 DIAGNOSIS — Z3202 Encounter for pregnancy test, result negative: Secondary | ICD-10-CM

## 2016-11-15 DIAGNOSIS — R52 Pain, unspecified: Secondary | ICD-10-CM

## 2016-11-15 MED ORDER — OXYCODONE HCL 5 MG PO TABS
10.0000 mg | ORAL_TABLET | Freq: Once | ORAL | Status: AC
  Administered 2016-11-15: 10 mg via ORAL
  Filled 2016-11-15: qty 2

## 2016-11-15 MED ORDER — LIDOCAINE-EPINEPHRINE (PF) 2 %-1:200000 IJ SOLN
10.0000 mL | Freq: Once | INTRAMUSCULAR | Status: AC
  Administered 2016-11-15: 10 mL

## 2016-11-15 MED ORDER — CEPHALEXIN 500 MG PO CAPS: 500.0000 mg | ORAL_CAPSULE | Freq: Four times a day (QID) | ORAL | 0 refills | Status: AC

## 2016-11-15 MED ORDER — HYDROCODONE-ACETAMINOPHEN 5-325 MG PO TABS: 1.0000 | ORAL_TABLET | Freq: Four times a day (QID) | ORAL | 0 refills | Status: AC | PRN

## 2016-11-15 MED ORDER — LIDOCAINE-EPINEPHRINE (PF) 2 %-1:200000 IJ SOLN
INTRAMUSCULAR | Status: AC
  Filled 2016-11-15: qty 20

## 2016-11-15 NOTE — ED Provider Notes (Signed)
[facility] Provider Note   CSN: [id number] Arrival date & time: 11/15/16  0405     History   Chief Complaint Chief Complaint  Patient presents with  . Groin Pain    HPI Aimee Wong is a 21 y.o. female with a hx of no major medical problems presents to the Emergency Department complaining of gradual, persistent, progressively worsening left groin pain at the site of I&D earlier tonight. Pt reports the pain began after she arrived home.  She reports taking tylenol without relief.  She states the pain is a "burning" sensation at the site of the incision.  She denies pain in the groin itself, abd pain, leg pain.  She denies fever, chills, nausea, vomiting, extending redness.  Nothing makes the symptoms better and palpation makes them worse.       The history is provided by the patient, medical records and a significant other. No language interpreter was used.    Past Medical History:  Diagnosis Date  . Headache     There are no active problems to display for this patient.   No past surgical history on file.  OB History    Gravida Para Term Preterm AB Living   1             SAB TAB Ectopic Multiple Live Births                   Home Medications    Prior to Admission medications   Medication Sig Start Date End Date Taking? Authorizing Provider  cephALEXin (KEFLEX) 500 MG capsule Take 1 capsule (500 mg total) by mouth 4 (four) times daily. 11/15/16   Oryn Casanova, Dahlia Client, PA-C  HYDROcodone-acetaminophen (NORCO/VICODIN) 5-325 MG tablet Take 1 tablet by mouth every 6 (six) hours as needed. 11/15/16   [place] Masaki, Dahlia Client, PA-C  metroNIDAZOLE (FLAGYL) 500 MG tablet Take 1 tablet (500 mg total) by mouth 2 (two) times daily. 07/06/16   Katrinka Blazing, [place], CNM  nitrofurantoin, macrocrystal-monohydrate, (MACROBID) 100 MG capsule Take 1 capsule (100 mg total) by mouth 2 (two) times daily. 07/05/16   Katrinka Blazing, [place], CNM  Prenat w/o A Vit-FeFum-FePo-FA (CONCEPT OB) 130-92.4-1 MG  CAPS Take 1 tablet by mouth daily. 07/05/16   Katrinka Blazing, [place], CNM  terconazole (TERAZOL 7) 0.4 % vaginal cream Place 1 applicator vaginally at bedtime. 07/06/16   Dorathy Kinsman, CNM    Family History No family history on file.  Social History Social History  Substance Use Topics  . Smoking status: Never Smoker  . Smokeless tobacco: Never Used  . Alcohol use No     Allergies   Patient has no known allergies.   Review of Systems Review of Systems  Constitutional: Negative for chills and fever.  Gastrointestinal: Negative for nausea and vomiting.  Endocrine: Negative for polydipsia, polyphagia and polyuria.  Skin: Positive for wound ( I&D site).       Abscess  Allergic/Immunologic: Negative for immunocompromised state.  Hematological: Does not bruise/bleed easily.  Psychiatric/Behavioral: The patient is not nervous/anxious.      Physical Exam Updated Vital Signs BP 109/85 (BP Location: Right Arm)   Pulse 85   Temp 98.5 F (36.9 C) (Oral)   Resp 18   LMP 10/23/2016   SpO2 96%   Physical Exam  Constitutional: She appears well-developed and well-nourished. No distress.  HENT:  Head: Normocephalic.  Eyes: Conjunctivae are normal. No scleral icterus.  Neck: Normal range of motion.  Cardiovascular: Normal rate, regular rhythm and intact distal pulses.  Pulses:      Radial pulses are 2+ on the right side, and 2+ on the left side.  Pulmonary/Chest: Effort normal.  Abdominal: Soft. Normal appearance. She exhibits no distension. There is no tenderness.  Genitourinary:  Genitourinary Comments: I&D site on the left side of the mons pubis is present with packing in place.  No extending erythema.  No inguinal lymphadenopathy.  Minimal drainage to the bandaging.  TTP at the incision site.    Musculoskeletal: Normal range of motion.  Neurological: She is alert.  Skin: Skin is warm and dry.  Nursing note and vitals reviewed.    ED Treatments / Results    Procedures Procedures (including critical care time)  Medications Ordered in ED Medications  oxyCODONE (Oxy IR/ROXICODONE) immediate release tablet 10 mg (10 mg Oral Given 11/15/16 6812)     Initial Impression / Assessment and Plan / ED Course  I have reviewed the triage vital signs and the nursing notes.  Pertinent labs & imaging results that were available during my care of the patient were reviewed by me and considered in my medical decision making (see chart for details).     Patient evaluated earlier in the evening with abscess to the mons pubis. I&D at that time. Patient with good pain control at time of discharge however returns with increasing pain uncontrolled at home with Tylenol. Bandage removed and site inspected. No extending erythema. Abdomen is soft and nontender. No inguinal lymphadenopathy. No significant drainage from the site. Patient's pain is directly at the site of incision. Packing is in place. Patient given pain medicine here in the emergency department and top pack was placed at the site with complete resolution of her pain.  Patient will be discharged home with several tablets of Vicodin for pain control. She started back emergency department if her pain remains out of control, she develops fevers, nausea or vomiting or other concerns. Patient and significant other state understanding and are in agreement with the plan for discharge home.  Final Clinical Impressions(s) / ED Diagnoses   Final diagnoses:  Pain  Abscess    New Prescriptions New Prescriptions   HYDROCODONE-ACETAMINOPHEN (NORCO/VICODIN) 5-325 MG TABLET    Take 1 tablet by mouth every 6 (six) hours as needed.     Haven Foss, Boyd Kerbs 11/15/16 7517    Shon Baton, MD 11/15/16 [phone]

## 2016-11-15 NOTE — Discharge Instructions (Signed)
1. Medications: Keflex - complete course, usual home medications 2. Treatment: rest, drink plenty of fluids, use warm compresses, flush abscess with warm water several times per day 3. Follow Up: Please followup with your primary doctor or the ED in 2-3 days for discussion of your diagnoses and further evaluation after today's visit; if you do not have a primary care doctor use the resource guide provided to find one; Please return to the ER for fevers, chills, nausea, vomiting or other signs of infection

## 2016-11-15 NOTE — Discharge Instructions (Signed)
See discharge instructions from earlier tonight

## 2016-11-15 NOTE — ED Triage Notes (Signed)
Pt states I&D to left groin 1 hour ago in ER. States she was discharged but the pain to the site is stinging and unbearable. PT requesting pain medication.

## 2016-11-15 NOTE — ED Provider Notes (Signed)
[facility]PT Provider Note   CSN: [id number] Arrival date & time: 11/14/16  1912     History   Chief Complaint Chief Complaint  Patient presents with  . Possible Pregnancy  . Lymphadenopathy    HPI Aimee Wong is a 21 y.o. female with no major medical problems presents to the Emergency Department complaining of gradual, persistent, progressively worsening Swelling and pain to the left side of the mons pubis just medial to that inguinal region. Patient reports she noted the swelling and tenderness this morning. Palpation makes the symptoms worse. Nothing makes it better. No treatments prior to arrival. Patient denies history of previous abscess. She does report that she uses a razor to shave her genitals.  Patient reports that her last normal menstrual cycle was on 09/27/16 and again on 7/30 but this was shorter than normal. Patient reports she is sexually active with 1 female partner. No contraceptive usage. Patient reports it "okay" if she gets pregnant. Patient reports took pregnancy test at home yesterday which was initially negative but after letting it sit on the counter for greater than 1 hour she thought it might have turned positive. Patient did not take a second pregnancy test. Patient has previous pregnancy.   The history is provided by the patient and medical records. No language interpreter was used.    Past Medical History:  Diagnosis Date  . Headache     There are no active problems to display for this patient.   History reviewed. No pertinent surgical history.  OB History    Gravida Para Term Preterm AB Living   1             SAB TAB Ectopic Multiple Live Births                   Home Medications    Prior to Admission medications   Medication Sig Start Date End Date Taking? Authorizing Provider  cephALEXin (KEFLEX) 500 MG capsule Take 1 capsule (500 mg total) by mouth 4 (four) times daily. 11/15/16   Lavella Myren, Dahlia Client, PA-C  metroNIDAZOLE (FLAGYL)  500 MG tablet Take 1 tablet (500 mg total) by mouth 2 (two) times daily. 07/06/16   Katrinka Blazing, [place], CNM  nitrofurantoin, macrocrystal-monohydrate, (MACROBID) 100 MG capsule Take 1 capsule (100 mg total) by mouth 2 (two) times daily. 07/05/16   Katrinka Blazing, [place], CNM  Prenat w/o A Vit-FeFum-FePo-FA (CONCEPT OB) 130-92.4-1 MG CAPS Take 1 tablet by mouth daily. 07/05/16   Katrinka Blazing, [place], CNM  terconazole (TERAZOL 7) 0.4 % vaginal cream Place 1 applicator vaginally at bedtime. 07/06/16   Dorathy Kinsman, CNM    Family History History reviewed. No pertinent family history.  Social History Social History  Substance Use Topics  . Smoking status: Never Smoker  . Smokeless tobacco: Never Used  . Alcohol use No     Allergies   Patient has no known allergies.   Review of Systems Review of Systems  Constitutional: Negative for appetite change, diaphoresis, fatigue, fever and unexpected weight change.  HENT: Negative for mouth sores.   Eyes: Negative for visual disturbance.  Respiratory: Negative for cough, chest tightness, shortness of breath and wheezing.   Cardiovascular: Negative for chest pain.  Gastrointestinal: Negative for abdominal pain, constipation, diarrhea, nausea and vomiting.  Endocrine: Negative for polydipsia, polyphagia and polyuria.  Genitourinary: Negative for dysuria, frequency, hematuria and urgency.  Musculoskeletal: Negative for back pain and neck stiffness.  Skin: Positive for color change. Negative for rash.  Allergic/Immunologic: Negative for  immunocompromised state.  Neurological: Negative for syncope, light-headedness and headaches.  Hematological: Does not bruise/bleed easily.  Psychiatric/Behavioral: Negative for sleep disturbance. The patient is not nervous/anxious.      Physical Exam Updated Vital Signs BP 122/83 (BP Location: Right Arm)   Pulse 60   Temp 99 F (37.2 C) (Oral)   Resp 18   LMP 10/23/2016   SpO2 100%   Breastfeeding? Unknown    Physical Exam  Constitutional: She appears well-developed and well-nourished. No distress.  Awake, alert, nontoxic appearance  HENT:  Head: Normocephalic and atraumatic.  Mouth/Throat: Oropharynx is clear and moist. No oropharyngeal exudate.  Eyes: Conjunctivae are normal. No scleral icterus.  Neck: Normal range of motion. Neck supple.  Cardiovascular: Normal rate, regular rhythm and intact distal pulses.   Pulmonary/Chest: Effort normal and breath sounds normal. No respiratory distress. She has no wheezes.  Equal chest expansion  Abdominal: Soft. Bowel sounds are normal. She exhibits no mass. There is no tenderness. There is no rebound and no guarding. Hernia confirmed negative in the right inguinal area and confirmed negative in the left inguinal area.  Genitourinary:    Pelvic exam was performed with patient supine. No labial fusion. There is no rash, tenderness, lesion or injury on the right labia. There is no rash, tenderness, lesion or injury on the left labia.  Musculoskeletal: Normal range of motion. She exhibits no edema.  Lymphadenopathy:       Right: No inguinal adenopathy present.       Left: No inguinal adenopathy present.  Neurological: She is alert.  Speech is clear and goal oriented Moves extremities without ataxia  Skin: Skin is warm and dry. She is not diaphoretic. There is erythema.  Psychiatric: She has a normal mood and affect.  Nursing note and vitals reviewed.    ED Treatments / Results  Labs (all labs ordered are listed, but only abnormal results are displayed) Labs Reviewed  POC URINE PREG, ED    Procedures .Marland KitchenIncision and Drainage Date/Time: 11/15/2016 1:46 AM Performed by: Dierdre Forth Authorized by: Dierdre Forth   Consent:    Consent obtained:  Verbal   Consent given by:  Patient   Risks discussed:  Bleeding, incomplete drainage and infection   Alternatives discussed:  No treatment, observation and alternative  treatment Location:    Type:  Abscess   Size:  2x4cm   Location:  Anogenital   Anogenital location: left mons pubis. Pre-procedure details:    Skin preparation:  Betadine Anesthesia (see MAR for exact dosages):    Anesthesia method:  Local infiltration   Local anesthetic:  Lidocaine 2% WITH epi (3mL) Procedure type:    Complexity:  Simple Procedure details:    Needle aspiration: yes     Needle size:  18 G   Incision types:  Single straight   Scalpel blade:  11   Wound management:  Probed and deloculated and irrigated with saline   Drainage:  Bloody and purulent   Drainage amount:  Moderate   Wound treatment:  Wound left open   Packing materials:  1/2 in gauze Post-procedure details:    Patient tolerance of procedure:  Tolerated well, no immediate complications Comments:     Abscess identified with bedside ultrasound. Anesthesia obtained. Initial assessment with ultrasound-guided needle aspiration with aspiration of 2 mL's of purulent drainage.  Subsequent incision with 11 blade reveals moderate amount of additional pus.  Packing placed due to depth of abscess.   (including critical care time)  EMERGENCY DEPARTMENT  US SOFT TISSUE INTERPRETATION "Study: Limited Soft Tissue Ultrasound"  INDICATIONS: Pain and Soft tissue infection Multiple views of the body part were obtained in real-time with a multi-frequency linear probe  PERFORMED BY: Myself IMAGES ARCHIVED?: Yes SIDE:Left BODY PART:mons pubis INTERPRETATION:  Abcess present    Medications Ordered in ED Medications  lidocaine-EPINEPHrine (XYLOCAINE W/EPI) 2 %-1:200000 (PF) injection 10 mL (10 mLs Infiltration Given 11/15/16 0120)     Initial Impression / Assessment and Plan / ED Course  I have reviewed the triage vital signs and the nursing notes.  Pertinent labs & imaging results that were available during my care of the patient were reviewed by me and considered in my medical decision making (see chart for  details).     Patient with skin abscess amenable to incision and drainage.  Abscess was deep therefore, packing was placed.  Wound recheck in 2 days. Encouraged home warm soaks and flushing.  Mild signs of cellulitis is surrounding skin.  Will d/c to home with Keflex.    Additionally, patient with concern about possible pregnancy. Urine pregnancy test negative here..   Final Clinical Impressions(s) / ED Diagnoses   Final diagnoses:  Abscess  Negative pregnancy test    New Prescriptions New Prescriptions   CEPHALEXIN (KEFLEX) 500 MG CAPSULE    Take 1 capsule (500 mg total) by mouth 4 (four) times daily.     Margaret Cockerill, Boyd Kerbs 11/15/16 0155    Shon Baton, MD 11/15/16 [phone]

## 2016-11-15 NOTE — ED Notes (Signed)
Unable to get signature due to malfunctioning sig pad.  Verbalized understanding of instructions.

## 2016-11-17 ENCOUNTER — Encounter (HOSPITAL_COMMUNITY): Payer: Self-pay | Admitting: Emergency Medicine

## 2016-11-17 ENCOUNTER — Emergency Department (HOSPITAL_COMMUNITY)
Admission: EM | Admit: 2016-11-17 | Discharge: 2016-11-17 | Disposition: A | Discharge status: Home / Self Care | Payer: Medicaid Other | Attending: Emergency Medicine | Admitting: Emergency Medicine

## 2016-11-17 DIAGNOSIS — Z09 Encounter for follow-up examination after completed treatment for conditions other than malignant neoplasm: Secondary | ICD-10-CM

## 2016-11-17 DIAGNOSIS — Z48817 Encounter for surgical aftercare following surgery on the skin and subcutaneous tissue: Secondary | ICD-10-CM | POA: Insufficient documentation

## 2016-11-17 NOTE — ED Provider Notes (Signed)
[facility] Provider Note   CSN: [id number] Arrival date & time: 11/17/16  1447     History   Chief Complaint Chief Complaint  Patient presents with  . Follow-up    HPI Aimee Wong is a 21 y.o. female presenting for abscess recheck from incision and drainage 2 days ago. Abscess to the left mons pubis just medial to the inguinal area. Reporting improvement in swelling and healing. Patient would like to have packing and dressing changed. No fever, chills, nausea, vomiting, purulent.  HPI  Past Medical History:  Diagnosis Date  . Headache     There are no active problems to display for this patient.   History reviewed. No pertinent surgical history.  OB History    Gravida Para Term Preterm AB Living   1             SAB TAB Ectopic Multiple Live Births                   Home Medications    Prior to Admission medications   Medication Sig Start Date End Date Taking? Authorizing Provider  cephALEXin (KEFLEX) 500 MG capsule Take 1 capsule (500 mg total) by mouth 4 (four) times daily. 11/15/16   Muthersbaugh, Dahlia Client, PA-C  HYDROcodone-acetaminophen (NORCO/VICODIN) 5-325 MG tablet Take 1 tablet by mouth every 6 (six) hours as needed. 11/15/16   Muthersbaugh, Dahlia Client, PA-C  metroNIDAZOLE (FLAGYL) 500 MG tablet Take 1 tablet (500 mg total) by mouth 2 (two) times daily. 07/06/16   Katrinka Blazing, [place], CNM  nitrofurantoin, macrocrystal-monohydrate, (MACROBID) 100 MG capsule Take 1 capsule (100 mg total) by mouth 2 (two) times daily. 07/05/16   Katrinka Blazing, [place], CNM  Prenat w/o A Vit-FeFum-FePo-FA (CONCEPT OB) 130-92.4-1 MG CAPS Take 1 tablet by mouth daily. 07/05/16   Katrinka Blazing, [place], CNM  terconazole (TERAZOL 7) 0.4 % vaginal cream Place 1 applicator vaginally at bedtime. 07/06/16   Dorathy Kinsman, CNM    Family History History reviewed. No pertinent family history.  Social History Social History  Substance Use Topics  . Smoking status: Never Smoker  . Smokeless tobacco:  Never Used  . Alcohol use No     Allergies   Patient has no known allergies.   Review of Systems Review of Systems  Constitutional: Negative for chills and fever.  Respiratory: Negative for cough, shortness of breath, wheezing and stridor.   Cardiovascular: Negative for chest pain and palpitations.  Gastrointestinal: Negative for nausea and vomiting.  Genitourinary: Negative for difficulty urinating, dysuria, flank pain, hematuria, pelvic pain, vaginal discharge and vaginal pain.  Musculoskeletal: Negative for neck pain and neck stiffness.  Skin: Positive for wound. Negative for color change, pallor and rash.  Neurological: Negative for dizziness, seizures, syncope, light-headedness and headaches.     Physical Exam Updated Vital Signs BP 105/60 (BP Location: Left Arm)   Pulse 65   Temp 98 F (36.7 C) (Oral)   Resp 14   Ht 5\' 2"  (1.575 m)   Wt 54.4 kg (120 lb)   LMP 10/23/2016   SpO2 99%   BMI 21.95 kg/m   Physical Exam  Constitutional: She appears well-developed and well-nourished. No distress.  Afebrile, nontoxic-appearing, sitting comfortably in chair in no acute distress.  HENT:  Head: Normocephalic and atraumatic.  Eyes: Conjunctivae and EOM are normal.  Neck: Normal range of motion.  Cardiovascular: Normal rate, regular rhythm and normal heart sounds.   No murmur heard. Pulmonary/Chest: Effort normal and breath sounds normal. No respiratory distress. She has  no wheezes. She has no rales.  Genitourinary:  Genitourinary Comments: No evidence of erythema, swelling, warmth, packing in place soaked in purulent drainage. Wound is well appearing and well healing.  Musculoskeletal: She exhibits no edema.  Neurological: She is alert.  Skin: Skin is warm and dry. She is not diaphoretic.  Psychiatric: She has a normal mood and affect.  Nursing note and vitals reviewed.    ED Treatments / Results  Labs (all labs ordered are listed, but only abnormal results are  displayed) Labs Reviewed - No data to display  EKG  EKG Interpretation None       Radiology No results found.  Procedures Procedures (including critical care time)   Medications Ordered in ED Medications - No data to display   Initial Impression / Assessment and Plan / ED Course  I have reviewed the triage vital signs and the nursing notes.  Pertinent labs & imaging results that were available during my care of the patient were reviewed by me and considered in my medical decision making (see chart for details).    Patient presents for abscess recheck after incision and drainage 48 hours ago.  Patient is reporting overall improvement. On exam wound is well-appearing no erythema, swelling or signs of infection. Packing in place and soaked in drainage.  Packing was removed and wound was flushed with normal saline without any purulence.   She is advised to apply warm soaks and continue her antibiotics as prescribed.   Discharge home with follow-up with PCP and patient was provided with resources to establish care with primary care in the area.  Discussed strict return precautions and advised to return to the emergency department if experiencing any new or worsening symptoms. Instructions were understood and patient agreed with discharge plan.  Final Clinical Impressions(s) / ED Diagnoses   Final diagnoses:  Encounter for recheck of abscess following incision and drainage    New Prescriptions New Prescriptions   No medications on file     Gregary Cromer 11/17/16 1839    Tegeler, Canary Brim, MD 11/17/16 2241

## 2016-11-17 NOTE — Discharge Instructions (Signed)
As discussed, continue to monitor for any change at the site including redness, swelling, increased pain, purulence, fever and return to be seen if you experience any of these.  Warm compresses or soaks. Continue to take your antibiotics as prescribed.  Follow-up with her primary care provider with the instructions provided today.  Return if any new concerning symptoms in the meantime.

## 2016-11-17 NOTE — ED Triage Notes (Signed)
Pt here for follow up from an abscess drainage 2 days ago to groin.  States improvement in symptoms.

## 2016-11-30 ENCOUNTER — Emergency Department (HOSPITAL_COMMUNITY)
Admission: EM | Admit: 2016-11-30 | Discharge: 2016-11-30 | Disposition: A | Discharge status: Home / Self Care | Payer: Self-pay | Attending: Emergency Medicine | Admitting: Emergency Medicine

## 2016-11-30 ENCOUNTER — Encounter (HOSPITAL_COMMUNITY): Payer: Self-pay | Admitting: Emergency Medicine

## 2016-11-30 DIAGNOSIS — J020 Streptococcal pharyngitis: Secondary | ICD-10-CM | POA: Insufficient documentation

## 2016-11-30 DIAGNOSIS — Z79899 Other long term (current) drug therapy: Secondary | ICD-10-CM | POA: Insufficient documentation

## 2016-11-30 LAB — RAPID STREP SCREEN (MED CTR MEBANE ONLY): Streptococcus, Group A Screen (Direct): POSITIVE — AB

## 2016-11-30 MED ORDER — PENICILLIN G BENZATHINE 1200000 UNIT/2ML IM SUSP
1.2000 10*6.[IU] | Freq: Once | INTRAMUSCULAR | Status: AC
  Administered 2016-11-30: 1.2 10*6.[IU] via INTRAMUSCULAR
  Filled 2016-11-30: qty 2

## 2016-11-30 MED ORDER — PREDNISONE 20 MG PO TABS: 40.0000 mg | ORAL_TABLET | Freq: Every day | ORAL | 0 refills | Status: DC

## 2016-11-30 MED ORDER — DEXAMETHASONE SODIUM PHOSPHATE 10 MG/ML IJ SOLN
10.0000 mg | Freq: Once | INTRAMUSCULAR | Status: AC
  Administered 2016-11-30: 10 mg via INTRAMUSCULAR
  Filled 2016-11-30: qty 1

## 2016-11-30 NOTE — ED Provider Notes (Signed)
[facility] Provider Note   CSN: [id number] Arrival date & time: 11/30/16  0335     History   Chief Complaint Chief Complaint  Patient presents with  . Sore Throat    HPI Aimee Wong is a 21 y.o. female with no major medical problems presents to the Emergency Department complaining of gradual, persistent, progressively worsening sore throat onset 10 PM tonight. Patient reports associated subjective fevers but no measured fever at home. She reports anterior throat pain without neck stiffness. Patient reports she often gets strep throat this time of year. No known sick contacts. No cough, rhinorrhea or nasal congestion. No other URI symptoms. No aggravating or alleviating factors. Patient reports pain with swelling but she is able to eat and drink.  The history is provided by the patient and medical records. No language interpreter was used.    Past Medical History:  Diagnosis Date  . Headache     There are no active problems to display for this patient.   History reviewed. No pertinent surgical history.  OB History    Gravida Para Term Preterm AB Living   1             SAB TAB Ectopic Multiple Live Births                   Home Medications    Prior to Admission medications   Medication Sig Start Date End Date Taking? Authorizing Provider  cephALEXin (KEFLEX) 500 MG capsule Take 1 capsule (500 mg total) by mouth 4 (four) times daily. 11/15/16   Gayle Martinez, Dahlia ClientHannah, PA-C  HYDROcodone-acetaminophen (NORCO/VICODIN) 5-325 MG tablet Take 1 tablet by mouth every 6 (six) hours as needed. 11/15/16   Janney Priego, Dahlia ClientHannah, PA-C  metroNIDAZOLE (FLAGYL) 500 MG tablet Take 1 tablet (500 mg total) by mouth 2 (two) times daily. 07/06/16   Katrinka BlazingSmith, [place], CNM  nitrofurantoin, macrocrystal-monohydrate, (MACROBID) 100 MG capsule Take 1 capsule (100 mg total) by mouth 2 (two) times daily. 07/05/16   Katrinka BlazingSmith, [place], CNM  Prenat w/o A Vit-FeFum-FePo-FA (CONCEPT OB) 130-92.4-1 MG CAPS  Take 1 tablet by mouth daily. 07/05/16   Katrinka BlazingSmith, [place], CNM  terconazole (TERAZOL 7) 0.4 % vaginal cream Place 1 applicator vaginally at bedtime. 07/06/16   Dorathy KinsmanSmith, [place], CNM    Family History No family history on file.  Social History Social History  Substance Use Topics  . Smoking status: Never Smoker  . Smokeless tobacco: Never Used  . Alcohol use No     Allergies   Patient has no known allergies.   Review of Systems Review of Systems  Constitutional: Negative for chills, fatigue and fever.  HENT: Positive for sore throat. Negative for congestion, dental problem, drooling, ear pain, facial swelling, mouth sores, postnasal drip, rhinorrhea, trouble swallowing and voice change.   Eyes: Negative for pain.  Respiratory: Negative for cough, chest tightness and shortness of breath.   Cardiovascular: Negative for chest pain.  Gastrointestinal: Negative for abdominal pain, nausea and vomiting.  Musculoskeletal: Negative for neck pain and neck stiffness.  Skin: Negative for rash.  Neurological: Negative for facial asymmetry and headaches.  Hematological: Negative for adenopathy.  Psychiatric/Behavioral: The patient is not nervous/anxious.   All other systems reviewed and are negative.    Physical Exam Updated Vital Signs BP 101/78 (BP Location: Left Arm)   Pulse 64   Temp 98.1 F (36.7 C) (Oral)   Resp 16   Ht 5\' 2"  (1.575 m)   Wt 52.2 kg (115 lb)  LMP 11/24/2016 (Approximate)   SpO2 100%   BMI 21.03 kg/m   Physical Exam  Constitutional: She appears well-developed and well-nourished. No distress.  HENT:  Head: Normocephalic and atraumatic.  Right Ear: Tympanic membrane, external ear and ear canal normal.  Left Ear: Tympanic membrane, external ear and ear canal normal.  Nose: Nose normal. No mucosal edema or rhinorrhea.  Mouth/Throat: Uvula is midline and mucous membranes are normal. Mucous membranes are not dry. No trismus in the jaw. No uvula swelling.  Oropharyngeal exudate, posterior oropharyngeal edema and posterior oropharyngeal erythema present. No tonsillar abscesses.  Posterior oropharynx with erythema, edema and exudate on the tonsils  Eyes: Conjunctivae are normal.  Neck: Normal range of motion, full passive range of motion without pain and phonation normal. No tracheal tenderness, no spinous process tenderness and no muscular tenderness present. No neck rigidity. No erythema and normal range of motion present. No Brudzinski's sign and no Kernig's sign noted.  Range of motion without pain  No midline or paraspinal tenderness Normal phonation No stridor Handling secretions without difficulty No nuchal rigidity or meningeal signs  Cardiovascular: Normal rate, regular rhythm and normal heart sounds.   Pulses:      Radial pulses are 2+ on the right side, and 2+ on the left side.  Pulmonary/Chest: Effort normal and breath sounds normal. No stridor. No respiratory distress. She has no decreased breath sounds. She has no wheezes.  Equal chest expansion, clear and equal breath sounds without focal wheezes, rhonchi or rales  Musculoskeletal: Normal range of motion.  Lymphadenopathy:       Head (right side): Submandibular and tonsillar adenopathy present. No submental, no preauricular, no posterior auricular and no occipital adenopathy present.       Head (left side): Submandibular and tonsillar adenopathy present. No submental, no preauricular, no posterior auricular and no occipital adenopathy present.    She has cervical adenopathy.       Right cervical: Superficial cervical adenopathy present. No deep cervical and no posterior cervical adenopathy present.      Left cervical: Superficial cervical adenopathy present. No deep cervical and no posterior cervical adenopathy present.  Neurological: She is alert.  Alert and oriented Moves all extremities without ataxia  Skin: Skin is warm and dry. She is not diaphoretic.  Psychiatric: She has a  normal mood and affect.  Nursing note and vitals reviewed.    ED Treatments / Results  Labs (all labs ordered are listed, but only abnormal results are displayed) Labs Reviewed  RAPID STREP SCREEN (NOT AT [facility]) - Abnormal; Notable for the following:       Result Value   Streptococcus, Group A Screen (Direct) POSITIVE (*)    All other components within normal limits    Procedures Procedures (including critical care time)  Medications Ordered in ED Medications  penicillin g benzathine (BICILLIN LA) 1200000 UNIT/2ML injection 1.2 Million Units (1.2 Million Units Intramuscular Given 11/30/16 0543)  dexamethasone (DECADRON) injection 10 mg (10 mg Intramuscular Given 11/30/16 0542)     Initial Impression / Assessment and Plan / ED Course  I have reviewed the triage vital signs and the nursing notes.  Pertinent labs & imaging results that were available during my care of the patient were reviewed by me and considered in my medical decision making (see chart for details).     Pt febrile with tonsillar exudate, cervical lymphadenopathy, & dysphagia; diagnosis of bacterial pharyngitis. Treated in the ED with steroids and PCN IM.  Pt appears mildly  dehydrated, discussed importance of water rehydration. Presentation non concerning for PTA or RPA. No trismus or uvula deviation. Specific return precautions discussed. Pt able to drink water in ED without difficulty with intact air way. Recommended PCP follow up.    BP 116/78 (BP Location: Left Arm)   Pulse 72   Temp 99.2 F (37.3 C) (Oral)   Resp 17   Ht  (1.575 m)   Wt 52.2 kg (115 lb)   LMP 11/24/2016 (Approximate)   SpO2 100%   BMI 21.03 kg/m    Final Clinical Impressions(s) / ED Diagnoses   Final diagnoses:  Acute streptococcal pharyngitis    New Prescriptions Current Discharge Medication List       Milta Deiters 11/30/16 8657    Alvira Monday, MD 12/03/16 1539

## 2016-11-30 NOTE — Discharge Instructions (Addendum)
1. Medications: usual home medications 2. Treatment: rest, drink plenty of fluids,  3. Follow Up: Please followup with your primary doctor in 5-7 days for discussion of your diagnoses and further evaluation after today's visit; if you do not have a primary care doctor use the resource guide provided to find one; Please return to the ER for high fevers, inability to swallow, worsening symptoms or other concerns

## 2016-11-30 NOTE — ED Triage Notes (Signed)
Pt reports sore throat X few days, pain with swallowing. Denies fever/chills

## 2018-10-12 IMAGING — DX DG HAND COMPLETE 3+V*R*
3 series · 3 of 3 positions shown · non-contrast
Comparison: None.

CLINICAL DATA: Right hand pain.

EXAM:
RIGHT HAND - COMPLETE 3+ VIEW

[hand pa]
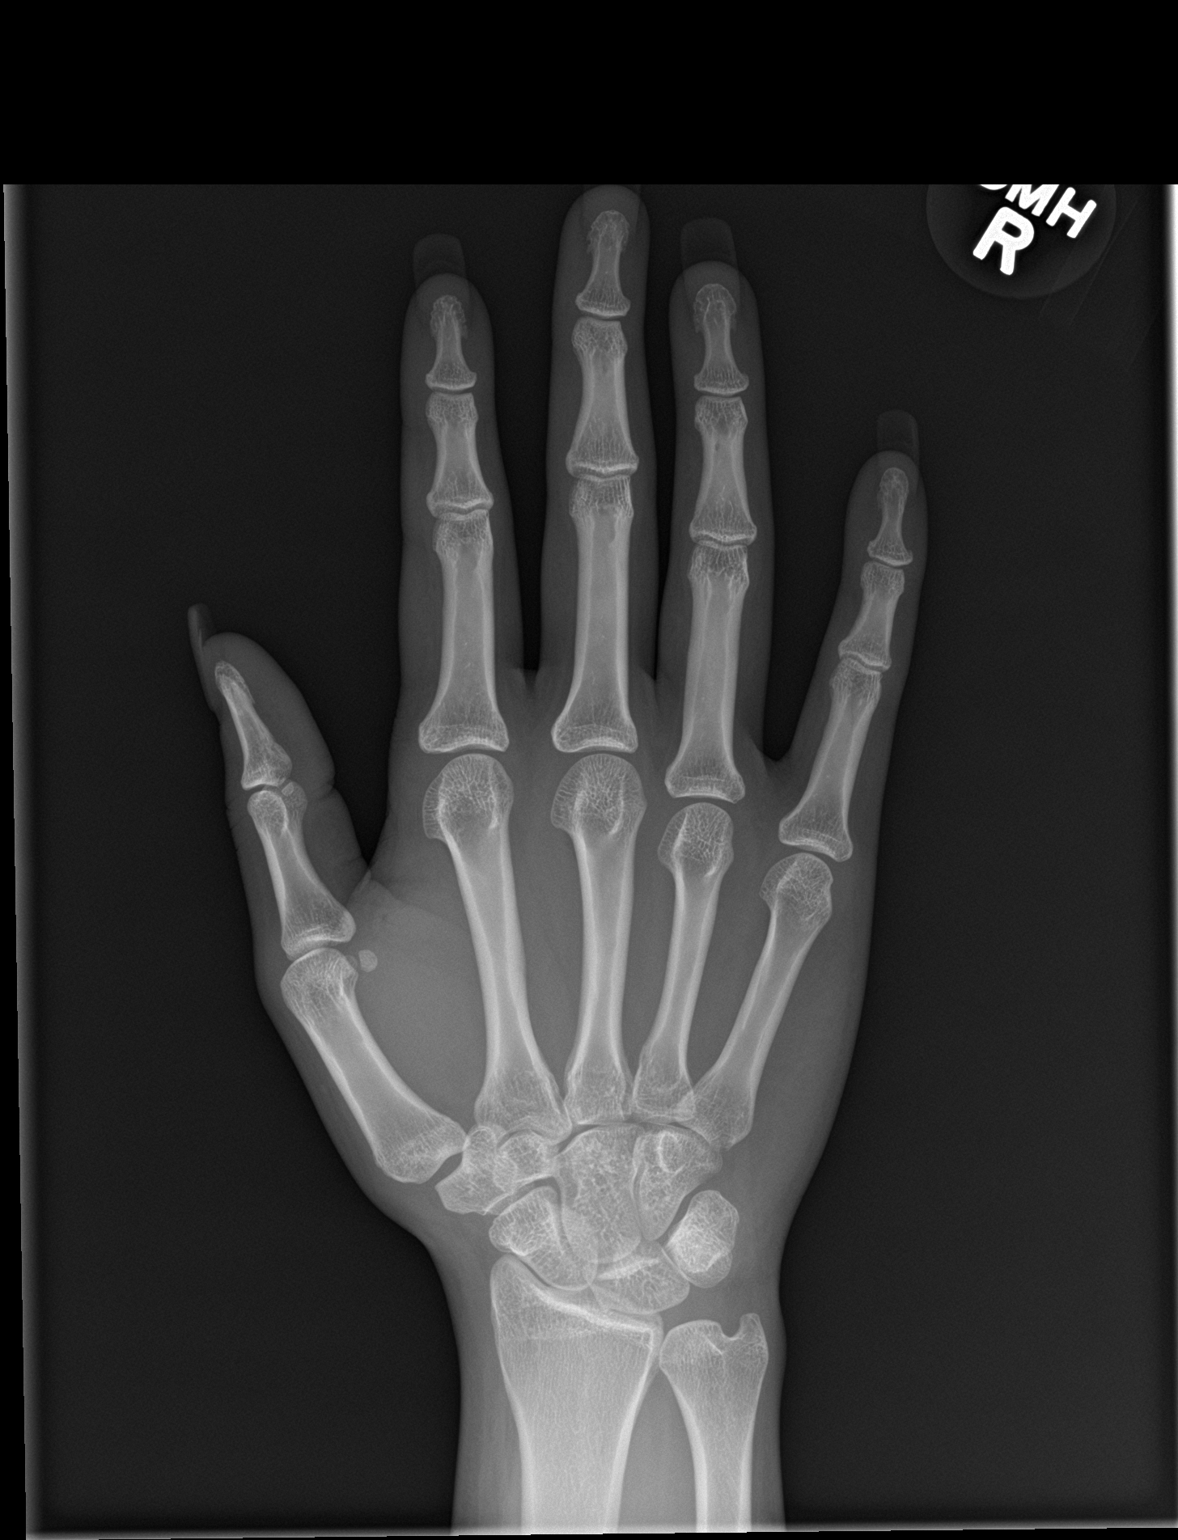

[hand obl]
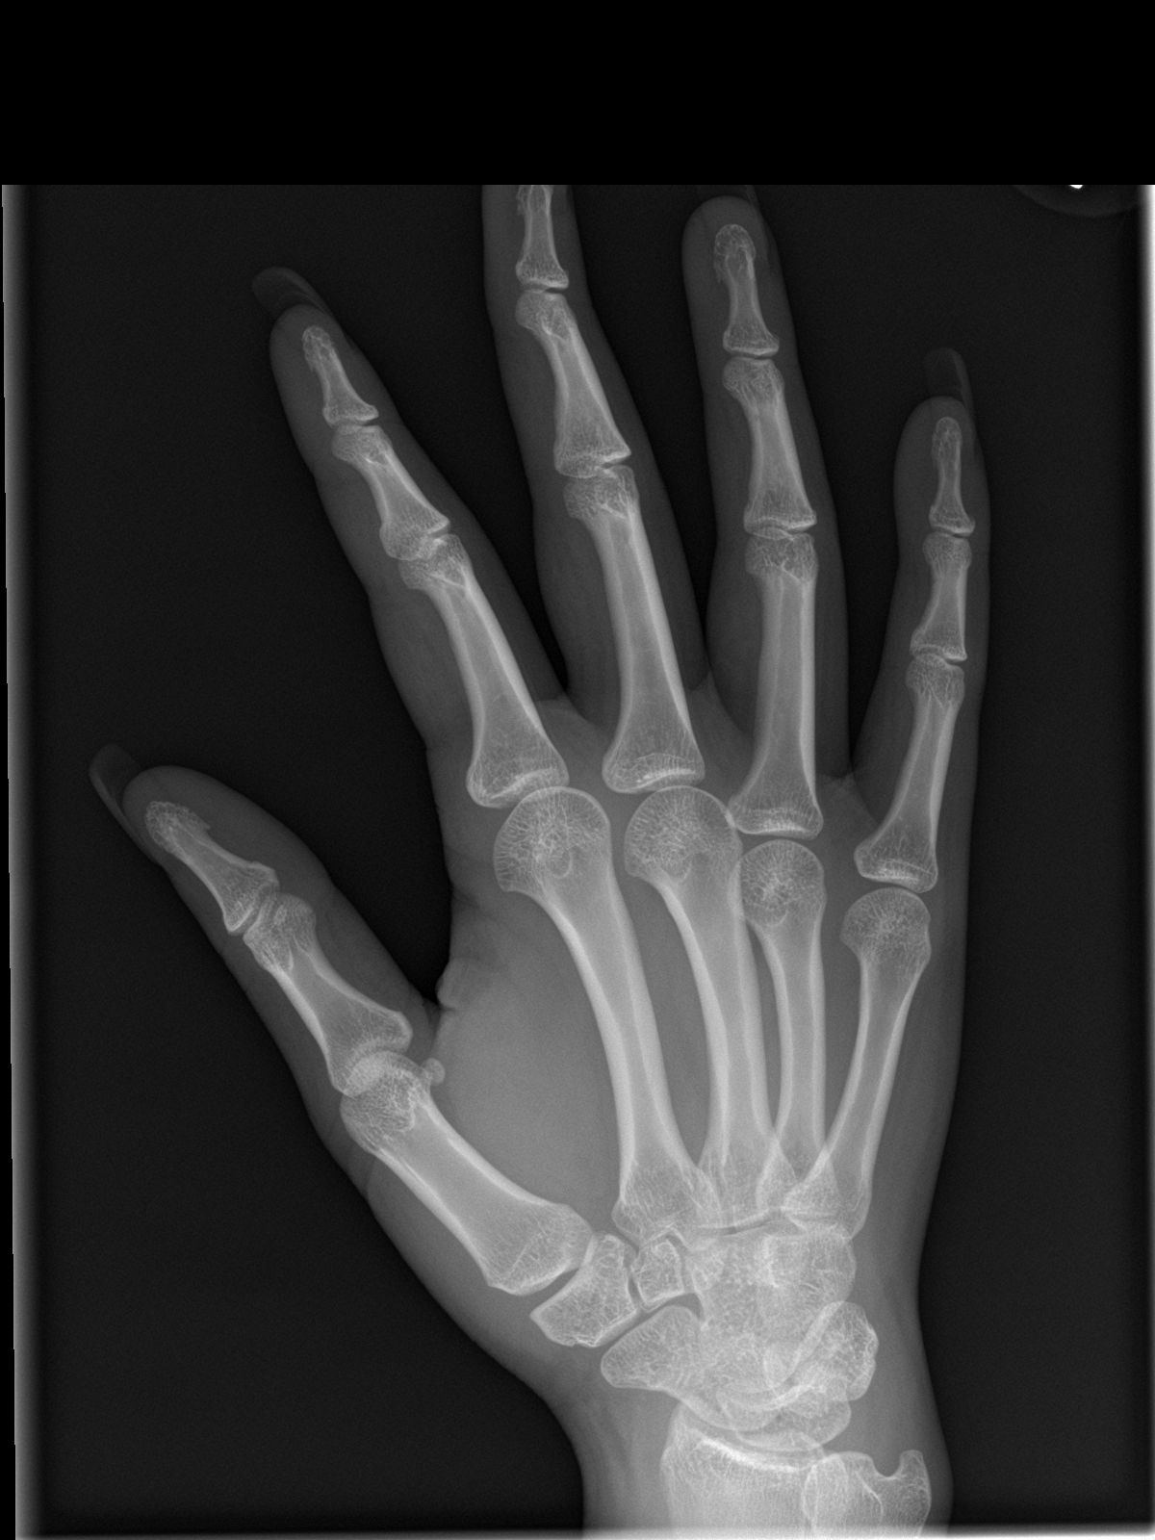

[hand lat]
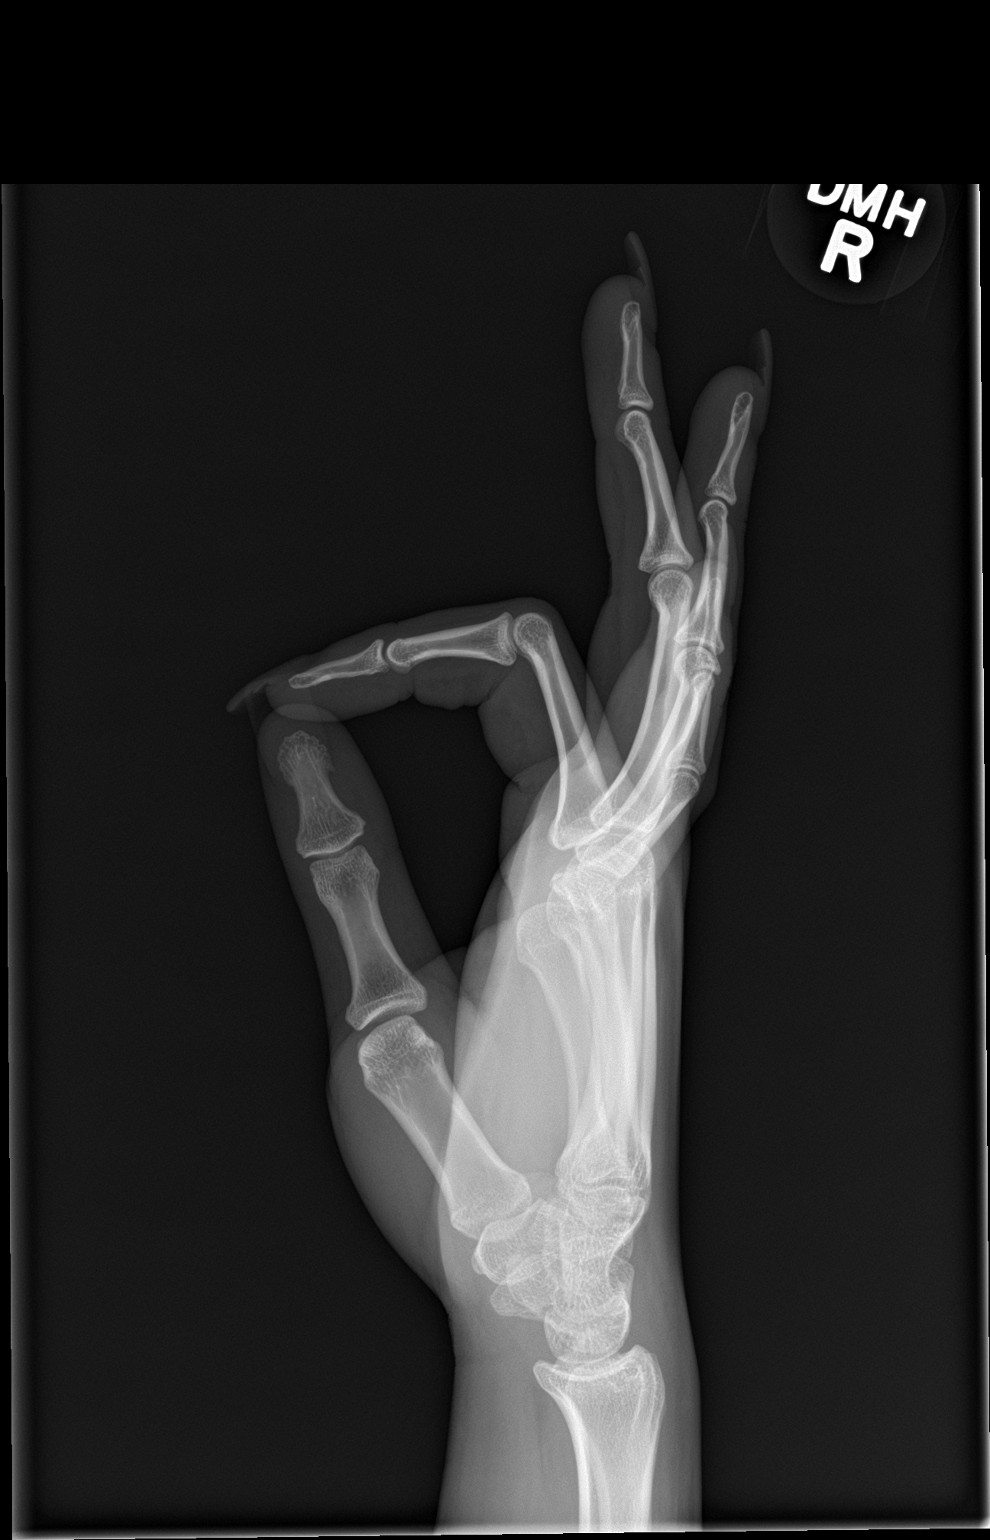

[3 of 3 positions shown; findings below may reference images not displayed]

FINDINGS: There is no evidence of fracture or dislocation. There is no
evidence of arthropathy or other focal bone abnormality. Soft
tissues are unremarkable.
IMPRESSION: Negative.
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
